# Patient Record
Sex: Male | Born: 2000 | Race: White | Hispanic: No | Marital: Single | Smoking: Never smoker
Health system: Southern US, Community
[De-identification: ages and names within clinical notes are randomized; demographics above are authoritative.]

## PROBLEM LIST (undated history)

## (undated) DIAGNOSIS — F419 Anxiety disorder, unspecified: Secondary | ICD-10-CM

## (undated) DIAGNOSIS — F845 Asperger's syndrome: Secondary | ICD-10-CM

## (undated) DIAGNOSIS — F319 Bipolar disorder, unspecified: Secondary | ICD-10-CM

## (undated) DIAGNOSIS — F329 Major depressive disorder, single episode, unspecified: Secondary | ICD-10-CM

## (undated) DIAGNOSIS — F32A Depression, unspecified: Secondary | ICD-10-CM

## (undated) HISTORY — PX: NO PAST SURGERIES: SHX2092

## (undated) HISTORY — DX: Bipolar disorder, unspecified: F31.9

---
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
## 2019-02-14 ENCOUNTER — Emergency Department
Admission: EM | Admit: 2019-02-14 | Discharge: 2019-02-15 | Disposition: A | Discharge status: Other Acute Inpatient Hospital | Payer: No Typology Code available for payment source | Attending: Emergency Medicine | Admitting: Emergency Medicine

## 2019-02-14 ENCOUNTER — Other Ambulatory Visit: Payer: Self-pay

## 2019-02-14 DIAGNOSIS — R454 Irritability and anger: Secondary | ICD-10-CM | POA: Diagnosis present

## 2019-02-14 DIAGNOSIS — F333 Major depressive disorder, recurrent, severe with psychotic symptoms: Secondary | ICD-10-CM | POA: Diagnosis not present

## 2019-02-14 DIAGNOSIS — R45851 Suicidal ideations: Secondary | ICD-10-CM | POA: Diagnosis not present

## 2019-02-14 DIAGNOSIS — F419 Anxiety disorder, unspecified: Secondary | ICD-10-CM | POA: Diagnosis not present

## 2019-02-14 DIAGNOSIS — F329 Major depressive disorder, single episode, unspecified: Secondary | ICD-10-CM | POA: Insufficient documentation

## 2019-02-14 DIAGNOSIS — F4310 Post-traumatic stress disorder, unspecified: Secondary | ICD-10-CM | POA: Diagnosis present

## 2019-02-14 DIAGNOSIS — F410 Panic disorder [episodic paroxysmal anxiety] without agoraphobia: Secondary | ICD-10-CM | POA: Diagnosis present

## 2019-02-14 DIAGNOSIS — F319 Bipolar disorder, unspecified: Secondary | ICD-10-CM | POA: Clinically undetermined

## 2019-02-14 DIAGNOSIS — F3160 Bipolar disorder, current episode mixed, unspecified: Secondary | ICD-10-CM

## 2019-02-14 DIAGNOSIS — Z20828 Contact with and (suspected) exposure to other viral communicable diseases: Secondary | ICD-10-CM | POA: Diagnosis not present

## 2019-02-14 DIAGNOSIS — Z79899 Other long term (current) drug therapy: Secondary | ICD-10-CM | POA: Insufficient documentation

## 2019-02-14 HISTORY — DX: Depression, unspecified: F32.A

## 2019-02-14 HISTORY — DX: Anxiety disorder, unspecified: F41.9

## 2019-02-14 LAB — CBC
HCT: 48.6 % (ref 39.0–52.0)
Hemoglobin: 16.4 g/dL (ref 13.0–17.0)
MCH: 29.9 pg (ref 26.0–34.0)
MCHC: 33.7 g/dL (ref 30.0–36.0)
MCV: 88.7 fL (ref 80.0–100.0)
Platelets: 271 10*3/uL (ref 150–400)
RBC: 5.48 MIL/uL (ref 4.22–5.81)
RDW: 12.9 % (ref 11.5–15.5)
WBC: 8.3 10*3/uL (ref 4.0–10.5)
nRBC: 0 % (ref 0.0–0.2)

## 2019-02-14 LAB — URINE DRUG SCREEN, QUALITATIVE (ARMC ONLY)
Amphetamines, Ur Screen: NOT DETECTED
Barbiturates, Ur Screen: NOT DETECTED
Benzodiazepine, Ur Scrn: POSITIVE — AB
Cannabinoid 50 Ng, Ur ~~LOC~~: NOT DETECTED
Cocaine Metabolite,Ur ~~LOC~~: NOT DETECTED
MDMA (Ecstasy)Ur Screen: NOT DETECTED
Methadone Scn, Ur: NOT DETECTED
Opiate, Ur Screen: NOT DETECTED
Phencyclidine (PCP) Ur S: NOT DETECTED
Tricyclic, Ur Screen: NOT DETECTED

## 2019-02-14 LAB — COMPREHENSIVE METABOLIC PANEL
ALT: 59 U/L — ABNORMAL HIGH (ref 0–44)
AST: 38 U/L (ref 15–41)
Albumin: 5 g/dL (ref 3.5–5.0)
Alkaline Phosphatase: 103 U/L (ref 38–126)
Anion gap: 15 (ref 5–15)
BUN: 17 mg/dL (ref 6–20)
CO2: 26 mmol/L (ref 22–32)
Calcium: 9.4 mg/dL (ref 8.9–10.3)
Chloride: 101 mmol/L (ref 98–111)
Creatinine, Ser: 0.93 mg/dL (ref 0.61–1.24)
GFR calc Af Amer: >60 mL/min (ref >60)
GFR calc non Af Amer: >60 mL/min (ref >60)
Glucose, Bld: 97 mg/dL (ref 70–99)
Potassium: 4.2 mmol/L (ref 3.5–5.1)
Sodium: 142 mmol/L (ref 135–145)
Total Bilirubin: 0.6 mg/dL (ref 0.3–1.2)
Total Protein: 8.4 g/dL — ABNORMAL HIGH (ref 6.5–8.1)

## 2019-02-14 LAB — ETHANOL: Alcohol, Ethyl (B): <10 mg/dL (ref <10)

## 2019-02-14 LAB — SALICYLATE LEVEL: Salicylate Lvl: <7 mg/dL (ref 2.8–30.0)

## 2019-02-14 LAB — SARS CORONAVIRUS 2 BY RT PCR (HOSPITAL ORDER, PERFORMED IN ~~LOC~~ HOSPITAL LAB): SARS Coronavirus 2: NEGATIVE

## 2019-02-14 LAB — ACETAMINOPHEN LEVEL: Acetaminophen (Tylenol), Serum: <10 ug/mL — ABNORMAL LOW (ref 10–30)

## 2019-02-14 MED ORDER — LORAZEPAM 2 MG PO TABS: 2.0000 mg | ORAL_TABLET | Freq: Two times a day (BID) | ORAL | Status: DC | PRN

## 2019-02-14 MED ORDER — MELATONIN 5 MG PO TABS
10.0000 mg | ORAL_TABLET | Freq: Every day | ORAL | Status: DC
  Administered 2019-02-14: 10 mg via ORAL
  Filled 2019-02-14: qty 2

## 2019-02-14 MED ORDER — ARIPIPRAZOLE 15 MG PO TABS
15.0000 mg | ORAL_TABLET | Freq: Every day | ORAL | Status: DC
  Filled 2019-02-14: qty 1

## 2019-02-14 MED ORDER — DULOXETINE HCL 60 MG PO CPEP: 60.0000 mg | ORAL_CAPSULE | Freq: Every day | ORAL | Status: DC

## 2019-02-14 MED ORDER — FLUOXETINE HCL 10 MG PO CAPS
10.0000 mg | ORAL_CAPSULE | Freq: Two times a day (BID) | ORAL | Status: DC
  Administered 2019-02-14: 22:00:00 10 mg via ORAL
  Filled 2019-02-14 (×2): qty 1

## 2019-02-14 MED ORDER — PRAZOSIN HCL 1 MG PO CAPS
1.0000 mg | ORAL_CAPSULE | Freq: Every day | ORAL | Status: DC
  Administered 2019-02-14: 22:00:00 1 mg via ORAL
  Filled 2019-02-14: qty 1
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
## 2019-02-14 NOTE — ED Triage Notes (Signed)
Pt brought here by mother, states his expressed thoughts of SI to his therapist and was referred to the ED. Pt states he almost overdosed last night but did not. States he recently changed medications.

## 2019-02-14 NOTE — ED Notes (Signed)
[facility] PENDING  CONSULT ?
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
## 2019-02-14 NOTE — ED Notes (Signed)
Pt dressed out with Larene Beach, RN and [employer] Officer Emogene Morgan. Pants, underwear, shirt and bracelet, shoes and phone placed in belonings

## 2019-02-14 NOTE — ED Notes (Signed)
Pt. Calm and cooperative, watching tv in room.

## 2019-02-14 NOTE — BH Assessment (Signed)
Patient has been accepted to [facility].  Patient assigned to [facility] Accepting physician is Dr. Jonelle Sports.  Call report to [phone].  Representative was [employer].   ER Staff is aware of it:  Vaughan Basta, ER Secretary  Juliane Lack, Patient's Nurse     Patient's mother [phone]) have been updated as well.  Address: [address] [place], [place] [address]  Patient can arrived anytime tomorrow (02/15/2019) after 8am.

## 2019-02-14 NOTE — ED Provider Notes (Signed)
[facility] Emergency Department Provider Note ____________________________________________   First MD Initiated Contact with Patient 02/14/19 1349     (approximate)  I have reviewed the triage vital signs and the nursing notes.   HISTORY  Chief Complaint Suicidal    HPI Samuel Russo is a 18 y.o. male with PMH as noted below who presents with suicidal ideation, gradual onset and worsening over the last few days.  The patient states that he thought about overdosing on medication last night although he did not decide on any specific medicine or where he would get it.  He denies any history of prior attempts.  He denies any acute medical complaints.  He states he has been compliant with his medication and denies drug or alcohol use.  Past Medical History:  Diagnosis Date  . Anxiety   . Asperger's disorder   . Depression     There are no active problems to display for this patient.   Past Surgical History:  Procedure Laterality Date  . NO PAST SURGERIES      Prior to Admission medications   Medication Sig Start Date End Date Taking? Authorizing Provider  ARIPiprazole (ABILIFY) 5 MG tablet Take 5 mg by mouth daily.    [provider]  lactobacillus acidophilus (BACID) TABS tablet Take 2 tablets by mouth 3 (three) times daily.    [provider]    Allergies Patient has no known allergies.  No family history on file.  Social History Social History   Tobacco Use  . Smoking status: Never Smoker  . Smokeless tobacco: Never Used  Substance Use Topics  . Alcohol use: No  . Drug use: No    Review of Systems  Constitutional: No fever. Eyes: No redness. ENT: No sore throat. Cardiovascular: Denies chest pain. Respiratory: Denies shortness of breath. Gastrointestinal: No vomiting or diarrhea.  Genitourinary: Negative for flank pain.  Musculoskeletal: Negative for back pain. Skin: Negative for rash. Neurological: Negative  for headache.   ____________________________________________   PHYSICAL EXAM:  VITAL SIGNS: ED Triage Vitals  Enc Vitals Group     BP 02/14/19 1328 113/70     Pulse Rate 02/14/19 1328 94     Resp 02/14/19 1328 17     Temp 02/14/19 1328 (!) 97.5 F (36.4 C)     Temp Source 02/14/19 1328 Oral     SpO2 02/14/19 1328 98 %     Weight 02/14/19 1330 208 lb (94.3 kg)     Height 02/14/19 1330 6' (1.829 m)     Head Circumference --      Peak Flow --      Pain Score 02/14/19 1329 0     Pain Loc --      Pain Edu? --      Excl. in [place]? --     Constitutional: Alert and oriented. Well appearing and in no acute distress. Eyes: Conjunctivae are normal.  Head: Atraumatic. Nose: No congestion/rhinnorhea. Mouth/Throat: Mucous membranes are moist.   Neck: Normal range of motion.  Cardiovascular: Good peripheral circulation. Respiratory: Normal respiratory effort.   Gastrointestinal: No distention.  Musculoskeletal: Extremities warm and well perfused.  Neurologic:  Normal speech and language. No gross focal neurologic deficits are appreciated.  Skin:  Skin is warm and dry. No rash noted. Psychiatric: Somewhat flat affect.  Speech and behavior are normal.  ____________________________________________   LABS (all labs ordered are listed, but only abnormal results are displayed)  Labs Reviewed  COMPREHENSIVE METABOLIC PANEL -  Abnormal; Notable for the following components:      Result Value   Total Protein 8.4 (*)    ALT 59 (*)    All other components within normal limits  ACETAMINOPHEN LEVEL - Abnormal; Notable for the following components:   Acetaminophen (Tylenol), Serum <10 (*)    All other components within normal limits  ETHANOL  SALICYLATE LEVEL  CBC  URINE DRUG SCREEN, QUALITATIVE ([facility] ONLY)   ____________________________________________  EKG   ____________________________________________  RADIOLOGY    ____________________________________________   PROCEDURES   Procedure(s) performed: No  Procedures  Critical Care performed: No ____________________________________________   INITIAL IMPRESSION / ASSESSMENT AND PLAN / ED COURSE  Pertinent labs & imaging results that were available during my care of the patient were reviewed by me and considered in my medical decision making (see chart for details).  18 year old male with PMH as noted above presents referred from his therapist with suicidal ideation.  The patient reports that he thought about overdosing last night but did not go through with it.  He denies any acute medical complaints.  On exam the patient is calm and cooperative.  His vital signs are normal.  The remainder of the exam is unremarkable.  Lab work-up is within normal limits.  We will obtain psychiatry consultation.  Disposition will be based on psychiatry team recommendations. ____________________________________________   FINAL CLINICAL IMPRESSION(S) / ED DIAGNOSES  Final diagnoses:  Suicidal ideation      NEW MEDICATIONS STARTED DURING THIS VISIT:  New Prescriptions   No medications on file     Note:  This document was prepared using Dragon voice recognition software and may include unintentional dictation errors.    Dionne BucySiadecki, Charese Abundis, MD 02/14/19 1441

## 2019-02-14 NOTE — BH Assessment (Signed)
Assessment Note  Samuel Russo is an 18 y.o. male who presents to the ER via his mother, after she was advised to do so by his therapist. While at his therapist appointment (02/14/2019), he shared he was having thoughts of ending his life and hearing voices. Per the patient, he and his family had an argument last night (02/15/2019) and he was remorseful about what happened. When he seen his therapist today, he thought everything was okay but was told he needed come to the ER.  Per the report of the patient's mother [phone]), the patient has an episode approximately every other month. However, last night wasn't the norm for him. Patient is known to take money or credits from his parents (once a month) and purchase things. When he is confronted about, he gets upset and move forwarded. However, last night he was more agitated then usual and he took a posturing stance towards his father. "This is not like him." Parents believe, its due to the recent changes in his medications. Patient has become more agitated and "short." After he does it, he apologizes for it. Patient is NOT physically aggressive or violent.  Patient was adopted when he was born by his current parents. It was agreed, that when turn [age] he could meet his biological mother if he wanted to.  Approximately two months ago, he met her for the first time. Patient states he do not believe his current mood has anything to do with meeting his mother. Per his (adopted) mother, the process of meeting his mother has gone well and the biological have been very respectful and pleasant towards them. While meeting her, they've found out more about his family history and diagnoses. Thus, they discovered one of the medications he was taking didn't work for her and believes it's the same with the patient. Patient currently receive medication management at [facility], with Dr. Maryruth Hancock.  During the interview, the patient was calm,  cooperative and pleasant. He was able to provide appropriate answers to the questions. Patient admits to hearing voices and they are telling him to end his life. He also admits to having thoughts of following through with what the voices are telling him by overdosing on medications.  Patient is diagnosed with autism and high functioning. He started home school last year due to having panic attacks while in the tradition setting. Per the patient and his mother, he is doing well and can handle most social settings, except large ones. He was inpatient at [facility] in 2018 due to depression. One of his family members committed suicide and the took it hard. Both the patient and his mother states, the hospitalization was beneficial and he had no problems while there.  Discussed patient with Psychiatrist (Dr. Leverne Humbles), patient is recommended for inpatient treatment.  Diagnosis: Depression  Past Medical History:  Past Medical History:  Diagnosis Date  . Anxiety   . Asperger's disorder   . Depression     Past Surgical History:  Procedure Laterality Date  . NO PAST SURGERIES      Family History: No family history on file.  Social History:  reports that he has never smoked. He has never used smokeless tobacco. He reports that he does not drink alcohol or use drugs.  Additional Social History:  Alcohol / Drug Use Pain Medications: See PTA Prescriptions: See PTA Over the Counter: See PTA History of alcohol / drug use?: No history of alcohol / drug abuse Longest period of sobriety (  when/how long): n/a  CIWA: CIWA-Ar BP: 113/70 Pulse Rate: 94 COWS:    Allergies: No Known Allergies  Home Medications: (Not in a hospital admission)   OB/GYN Status:  No LMP for male patient.  General Assessment Data Location of Assessment: [facility] ED TTS Assessment: In system Is this a Tele or Face-to-Face Assessment?: Face-to-Face Is this an Initial Assessment or a Re-assessment for this encounter?:  Initial Assessment Language Other than English: No Living Arrangements: Other (Comment)(Private Home) What gender do you identify as?: Male Marital status: Single Pregnancy Status: No Living Arrangements: Parent Can pt return to current living arrangement?: Yes Admission Status: Involuntary Petitioner: ED Attending Is patient capable of signing voluntary admission?: No(Under IVC) Referral Source: Self/Family/Friend Insurance type: [employer] [facility] Choice  Medical Screening Exam ([place]) Medical Exam completed: Yes  Crisis Care Plan Living Arrangements: Parent Legal Guardian: Mother, Father Name of Psychiatrist: [facility]. [facility]) Name of Therapist: Dr. Cletus Gash  Education Status Is patient currently in school?: Yes Current Grade: 12th Highest grade of school patient has completed: 11th Name of school: [employer] person: n/a IEP information if applicable: Home Bound due to anxiety  Risk to self with the past 6 months Suicidal Ideation: No-Not Currently/Within Last 6 Months Has patient been a risk to self within the past 6 months prior to admission? : Yes Suicidal Intent: No-Not Currently/Within Last 6 Months Has patient had any suicidal intent within the past 6 months prior to admission? : No Is patient at risk for suicide?: Yes Suicidal Plan?: No-Not Currently/Within Last 6 Months Has patient had any suicidal plan within the past 6 months prior to admission? : No Access to Means: No What has been your use of drugs/alcohol within the last 12 months?: Reports of none Previous Attempts/Gestures: No How many times?: 0 Other Self Harm Risks: Reports of none Triggers for Past Attempts: Other personal contacts(Death of friend) Intentional Self Injurious Behavior: None Family Suicide History: Unknown Recent stressful life event(s): Conflict (Comment), Other (Comment) Persecutory voices/beliefs?: Yes Depression: Yes Depression  Symptoms: Isolating, Guilt, Feeling worthless/self pity Substance abuse history and/or treatment for substance abuse?: No Suicide prevention information given to non-admitted patients: Not applicable  Risk to Others within the past 6 months Homicidal Ideation: No Does patient have any lifetime risk of violence toward others beyond the six months prior to admission? : No Thoughts of Harm to Others: No Current Homicidal Intent: No Current Homicidal Plan: No Access to Homicidal Means: No Identified Victim: Reports of none History of harm to others?: No Assessment of Violence: None Noted Violent Behavior Description: Reports of none Does patient have access to weapons?: No Criminal Charges Pending?: No Does patient have a court date: No Is patient on probation?: No  Psychosis Hallucinations: Auditory, With command Delusions: None noted  Mental Status Report Appearance/Hygiene: Unremarkable, In scrubs Eye Contact: Good Motor Activity: Freedom of movement, Unremarkable Speech: Logical/coherent, Unremarkable Level of Consciousness: Alert Mood: Depressed, Anxious, Sad, Pleasant Affect: Appropriate to circumstance Anxiety Level: Minimal Thought Processes: Coherent, Relevant Judgement: Unimpaired Orientation: Person, Place, Time, Situation, Appropriate for developmental age Obsessive Compulsive Thoughts/Behaviors: Minimal  Cognitive Functioning Concentration: Normal Memory: Recent Intact, Remote Intact Is patient IDD: No Insight: Fair Impulse Control: Fair Appetite: Good Have you had any weight changes? : No Change Sleep: No Change Total Hours of Sleep: 8 Vegetative Symptoms: None  ADLScreening [facility] Assessment Services) Patient's cognitive ability adequate to safely complete daily activities?: Yes Patient able to express need for assistance  with ADLs?: Yes Independently performs ADLs?: Yes (appropriate for developmental age)  Prior Inpatient Therapy Prior Inpatient  Therapy: Yes Prior Therapy Dates: 03/2017 Prior Therapy Facilty/Provider(s): [facility] Reason for Treatment: Depression  Prior Outpatient Therapy Prior Outpatient Therapy: Yes Prior Therapy Dates: Current Prior Therapy Facilty/Provider(s): [facility] Reason for Treatment: Medication Management Does patient have an ACCT team?: No Does patient have Intensive In-House Services?  : No Does patient have [facility] services? : No Does patient have P4CC services?: No  ADL Screening (condition at time of admission) Patient's cognitive ability adequate to safely complete daily activities?: Yes Is the patient deaf or have difficulty hearing?: No Does the patient have difficulty seeing, even when wearing glasses/contacts?: No Does the patient have difficulty concentrating, remembering, or making decisions?: No Patient able to express need for assistance with ADLs?: Yes Does the patient have difficulty dressing or bathing?: No Independently performs ADLs?: Yes (appropriate for developmental age) Does the patient have difficulty walking or climbing stairs?: No Weakness of Legs: None Weakness of Arms/Hands: None  Home Assistive Devices/Equipment Home Assistive Devices/Equipment: None  Therapy Consults (therapy consults require a physician order) PT Evaluation Needed: No OT Evalulation Needed: No SLP Evaluation Needed: No Abuse/Neglect Assessment (Assessment to be complete while patient is alone) Abuse/Neglect Assessment Can Be Completed: Yes Physical Abuse: Denies Verbal Abuse: Denies Sexual Abuse: Denies Exploitation of patient/patient's resources: Denies Self-Neglect: Denies Values / Beliefs Cultural Requests During Hospitalization: None Spiritual Requests During Hospitalization: None Consults Spiritual Care Consult Needed: No Social Work Consult Needed: No Regulatory affairs officer (For Healthcare) Does Patient Have a Medical Advance Directive?: No Would patient like  information on creating a medical advance directive?: No - Patient declined       Child/Adolescent Assessment Running Away Risk: Denies Bed-Wetting: Denies Destruction of Property: Denies Cruelty to Animals: Denies Stealing: Runner, broadcasting/film/video as Evidenced By: Parents credit cards to purchase things Rebellious/Defies Authority: Denies Satanic Involvement: Denies Science writer: Denies Problems at [employer]: Denies Gang Involvement: Denies  Disposition:  Disposition Initial Assessment Completed for this Encounter: Yes  On Site Evaluation by:   Reviewed with Physician:    Gunnar Fusi MS, LCAS, [facility], [place], [place] Therapeutic Triage Specialist 02/14/2019 4:19 PM

## 2019-02-14 NOTE — ED Notes (Signed)
[facility]  PENDING  PLACEMENT  CONSULT  DONE 

## 2019-02-14 NOTE — BH Assessment (Signed)
Referral information for Child/Adolescent Placement have been faxed to;    [facility] ([phone]) declined   [facility] ([phone])    [facility] [phone]),    [facility] [phone]),    [place] [phone] or [phone]),    [facility] [phone] -or- [phone])

## 2019-02-14 NOTE — ED Notes (Signed)
Pt. Accepted to [facility].  Pt. Can be transported after 8 a.m Thursday 6/18

## 2019-02-15 NOTE — ED Notes (Signed)
[facility]  DEPT  CALLED  FOR  TRANSFER 

## 2019-02-15 NOTE — ED Provider Notes (Signed)
-----------------------------------------   4:54 AM on 02/15/2019 -----------------------------------------   Blood pressure 114/66, pulse 97, temperature 98.3 F (36.8 C), temperature source Oral, resp. rate 16, height 1.829 m (6'), weight 94.3 kg, SpO2 98 %.  The patient is calm and cooperative at this time.  There have been no acute events since the last update.  Awaiting disposition plan from Behavioral Medicine team.   Hinda Kehr, MD 02/15/19 [phone]

## 2019-02-15 NOTE — ED Notes (Signed)
Pt's father, Fillmore Bynum, came to pick up patient's cell phone.  Cell phone given to father.  Pt's father also bought green duffle bag.

## 2019-05-17 DIAGNOSIS — F319 Bipolar disorder, unspecified: Secondary | ICD-10-CM | POA: Insufficient documentation

## 2022-10-15 ENCOUNTER — Ambulatory Visit: Payer: Self-pay

## 2023-11-29 DIAGNOSIS — K219 Gastro-esophageal reflux disease without esophagitis: Secondary | ICD-10-CM | POA: Insufficient documentation

## 2023-11-29 DIAGNOSIS — L729 Follicular cyst of the skin and subcutaneous tissue, unspecified: Secondary | ICD-10-CM | POA: Insufficient documentation

## 2024-05-04 NOTE — Progress Notes (Signed)
 Assessment/Plan:   1.  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    I explained that even if one is able to get off of the medication, it can take 6 months to reverse any potential SE.  I did not advise that the patient go off of medication, as this needs to be discussed with the patients prescribing physician and he may not be able to get off of meds due to hx of auditory hallucinations.    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  It appears that he was previously treated for his symptoms with Austedo and Ingrezza, which are medications for tardive dyskinesia.  The pathophysiology's of these disorders are different, although they can both come from antipsychotic medications.  Austedo and Ingrezza actually can worsen parkinsonism, and patient/mother described this.  - Patient's mother does ask for a second opinion on his psychiatric medications.  I did give her the name of Dr. Hisada, but did not provide the formal referral.  2.  Mild right hand dystonia  - His clumsiness is likely related to his diagnosis of autism.  Mother relates that he had trouble as a child doing fine motor and manual motor tasks and he was placed in occupational therapy.  This was even prior to being diagnosed with autism.  Nonetheless, given that he never had an MRI of the brain, I think it is important that we go ahead and do that.   Subjective:   Samuel Russo was seen today in the movement disorders clinic for neurologic consultation at the request of Ray Jacques BRAVO, PA-C.  The patient is noted to be a 23 year old male with a history of bipolar disorder, schizophrenia (mom denies this dx), and autism who is referred here for right hand tremor and a feeling of internal tremor. This patient is accompanied in the office by  his mother who supplements the history.  Medical records indicate that he was given Austedo and this did not help and it caused tremor and drooling.  He was also given ingrezza and that caused SE with drooling.   He was referred for further evaluation.  He is on cogentin and was just put back on it last week.  He was on it for 4 years; went off of it to trial ingrezza/austedo and is now back on it.    Developmentally, the patient walked when supposed to; talked when should.  Kindergarten at age 29 and had to repeat.  He was recommended for OT at age 60-11 for handwriting, buttoning, zipping, gripping pencil as he was behind on fine motor skills.  No neuroimaging until last year    Specific Symptoms:  Tremor: Yes.  Tremor has been going on 5 years but its been intermittent, R>L hand.  Its a rest and intentiontremor.   He is R handed.  Legs are never involved.  Face is never involved.  Tongue is never involved.   Off abilify  started at age 23 and off for a year; on fanapt x 3 years (was on fanapt and abilify  together previously).  Not worse with anxiety.  Not worse with caffeine.  On propranolol but only for social anxiety and doesn't use it for tremor Family hx of similar:  No., pt was adopted at 72 days old and bio fam hx is  limited Voice: no change Postural symptoms:  No.  Falls?  No. Bradykinesia symptoms: no bradykinesia noted Loss of smell:  No. Loss of taste:  No. Urinary Incontinence:  No. Difficulty Swallowing:  occasionally Handwriting, micrographia: No. Trouble with ADL's:  No.  Trouble buttoning clothing: No. Hallucinations:  has had auditory hallucinations but doesn't any longer (its been months); never visual hallucinations N/V:  No. Lightheaded:  Yes.  , mostly associated with bending down and getting back up and will get dizzy and have palpitation and sitting will resolve it  Syncope: No. Dyskinesia:  No. Prior exposure to reglan/antipsychotics: Yes.   (Currently fanapt; prior  abilify ; olanzapine).  Patient has also been on Austedo; lithium; benztropine, propranolol  Getting neuroimaging that the patient never had was a CT brain done March, 2025 and that was unremarkable.   ALLERGIES:   Allergies  Allergen Reactions   Valbenazine Other (See Comments)    Worsened tremors, drooling per pt    CURRENT MEDICATIONS:  Current Meds  Medication Sig   benztropine (COGENTIN) 1 MG tablet Take 1 mg by mouth 2 (two) times daily.   FANAPT 1 MG TABS Take 1 tablet by mouth at bedtime.   LORazepam  (ATIVAN ) 2 MG tablet Take 2 mg by mouth 2 (two) times daily as needed for anxiety.   Melatonin 10 MG TABS Take 10 mg by mouth at bedtime.   mirtazapine (REMERON) 15 MG tablet Take 15 mg by mouth at bedtime.   prazosin  (MINIPRESS ) 1 MG capsule Take 1 mg by mouth at bedtime. Increase to 2 capsule in 1 week   propranolol (INDERAL) 10 MG tablet Take 10 mg by mouth daily as needed.   [DISCONTINUED] DULoxetine  (CYMBALTA ) 60 MG capsule Take 60 mg by mouth daily.     Objective:   VITALS:   Vitals:   05/07/24 0857  BP: 119/77  Pulse: 65  SpO2: 98%  Weight: 193 lb 9.6 oz (87.8 kg)  Height: 5' 11 (1.803 m)    GEN:  The patient appears stated age and is in NAD.  He appears a bit anxious. HEENT:  Normocephalic, atraumatic.  The mucous membranes are moist. The superficial temporal arteries are without ropiness or tenderness. CV:  RRR Lungs:  CTAB Neck/HEME:  There are no carotid bruits bilaterally.  Neurological examination:  Orientation: The patient is alert and oriented x3.  He provides his own history fairly accurately, but does look to his mother for finer aspects. Cranial nerves: There is good facial symmetry. Extraocular muscles are intact.  There is some mild horizontal nystagmus that is only 3-4 beats at end gaze.  The visual fields are full to confrontational testing. The speech is fluent and clear. Soft palate rises symmetrically and there is no tongue deviation. Hearing  is intact to conversational tone. Sensation: Sensation is intact to light and pinprick throughout (facial, trunk, extremities). Vibration is intact at the bilateral big toe. There is no extinction with double simultaneous stimulation. There is no sensory dermatomal level identified. Motor: Strength is 5/5 in the bilateral upper and lower extremities.   Shoulder shrug is equal and symmetric.  There is no pronator drift. Deep tendon reflexes: Deep tendon reflexes are 2/4 at the bilateral biceps, triceps, brachioradialis, patella and achilles. Plantar responses are downgoing bilaterally.  Movement examination: Tone: There is normal tone in the bilateral upper extremities.  The tone in the lower extremities is normal.  Abnormal movements: Right hand is just slightly dystonic.  There is no rest tremor.  There  is no significant postural or intention tremor.  He is able to pour water from 1 glass to another without spilling.  He has trouble with the concept of Archimedes spirals, but there is no tremor with it.  Coordination:  There is no decremation with RAM's, with any form of RAMS, including alternating supination and pronation of the forearm, hand opening and closing, finger taps, heel taps and toe taps.  Gait and Station: The patient has no difficulty arising out of a deep-seated chair without the use of the hands. The patient's stride length is good.     I have reviewed and interpreted the following labs independently   Chemistry      Component Value Date/Time   NA 142 02/14/2019 1339   K 4.2 02/14/2019 1339   CL 101 02/14/2019 1339   CO2 26 02/14/2019 1339   BUN 17 02/14/2019 1339   CREATININE 0.93 02/14/2019 1339      Component Value Date/Time   CALCIUM 9.4 02/14/2019 1339   ALKPHOS 103 02/14/2019 1339   AST 38 02/14/2019 1339   ALT 59 (H) 02/14/2019 1339   BILITOT 0.6 02/14/2019 1339      No results found for: TSH Lab Results  Component Value Date   WBC 8.3 02/14/2019   HGB  16.4 02/14/2019   HCT 48.6 02/14/2019   MCV 88.7 02/14/2019   PLT 271 02/14/2019     Total time spent on today's visit was 60 minutes, including both face-to-face time and nonface-to-face time.  Time included that spent on review of records (prior notes available to me/labs/imaging if pertinent), discussing treatment and goals, answering patient's questions and coordinating care.  Cc:  Patient, No Pcp Per

## 2024-05-07 ENCOUNTER — Encounter: Payer: Self-pay | Admitting: Neurology

## 2024-05-07 ENCOUNTER — Ambulatory Visit (INDEPENDENT_AMBULATORY_CARE_PROVIDER_SITE_OTHER): Admitting: Neurology

## 2024-05-07 VITALS — BP 119/77 | HR 65 | Ht 71.0 in | Wt 193.6 lb

## 2024-05-07 DIAGNOSIS — G249 Dystonia, unspecified: Secondary | ICD-10-CM | POA: Diagnosis not present

## 2024-05-07 DIAGNOSIS — R440 Auditory hallucinations: Secondary | ICD-10-CM

## 2024-05-07 DIAGNOSIS — R251 Tremor, unspecified: Secondary | ICD-10-CM

## 2024-05-07 NOTE — Progress Notes (Signed)
 Prior authorization initiated for CPT [phone]. Approved: [id number].

## 2024-05-07 NOTE — Patient Instructions (Signed)
 You asked about a second opinion for psychiatry.  I often use Dr. Katheren Sleet.  Her contact information is as follows: Address: [address], [place], [place] [address] Phone: [phone]  You can ask your PCP for a referral , if that is needed.  I sent a referral for an MRI brain.

## 2024-05-17 ENCOUNTER — Ambulatory Visit
Admission: RE | Admit: 2024-05-17 | Discharge: 2024-05-17 | Disposition: A | Discharge status: Home / Self Care | Source: Ambulatory Visit | Attending: Neurology | Admitting: Neurology

## 2024-05-17 DIAGNOSIS — G249 Dystonia, unspecified: Secondary | ICD-10-CM | POA: Insufficient documentation

## 2024-05-17 DIAGNOSIS — R440 Auditory hallucinations: Secondary | ICD-10-CM | POA: Diagnosis present

## 2024-05-21 ENCOUNTER — Ambulatory Visit: Payer: Self-pay | Admitting: Neurology

## 2024-05-28 ENCOUNTER — Other Ambulatory Visit: Payer: Self-pay

## 2024-05-28 ENCOUNTER — Telehealth: Payer: Self-pay | Admitting: Neurology

## 2024-05-28 DIAGNOSIS — G249 Dystonia, unspecified: Secondary | ICD-10-CM

## 2024-05-28 DIAGNOSIS — R440 Auditory hallucinations: Secondary | ICD-10-CM

## 2024-05-28 DIAGNOSIS — R251 Tremor, unspecified: Secondary | ICD-10-CM

## 2024-05-28 NOTE — Telephone Encounter (Signed)
Referral sent to [facility]

## 2024-05-28 NOTE — Telephone Encounter (Signed)
 Called patients mom and she is looking to have patients doctors at [facility]. They are currently between PCP and Patient is waiting to get on board with a new PCP. Patient is still having tremors and it can take months to get in to [facility]. Asking if you would approve for me to send referral

## 2024-05-28 NOTE — Telephone Encounter (Signed)
 Pt mother called, she would like a referral sent to [facility] [place]. If you have questions you can call the mother back

## 2024-07-17 ENCOUNTER — Ambulatory Visit: Admitting: Psychiatry

## 2024-07-17 ENCOUNTER — Encounter: Payer: Self-pay | Admitting: Psychiatry

## 2024-07-17 VITALS — BP 110/68 | HR 78 | Temp 97.8°F | Ht 70.5 in | Wt 196.0 lb

## 2024-07-17 DIAGNOSIS — F4310 Post-traumatic stress disorder, unspecified: Secondary | ICD-10-CM | POA: Diagnosis not present

## 2024-07-17 DIAGNOSIS — F319 Bipolar disorder, unspecified: Secondary | ICD-10-CM

## 2024-07-17 DIAGNOSIS — F840 Autistic disorder: Secondary | ICD-10-CM

## 2024-07-17 DIAGNOSIS — R259 Unspecified abnormal involuntary movements: Secondary | ICD-10-CM | POA: Insufficient documentation

## 2024-07-17 DIAGNOSIS — F4010 Social phobia, unspecified: Secondary | ICD-10-CM | POA: Insufficient documentation

## 2024-07-17 NOTE — Progress Notes (Unsigned)
 Psychiatric Initial Adult Assessment   Patient Identification: Samuel Russo MRN:  [id number] Date of Evaluation:  07/17/2024 Referral Source: Samuel Russo Chief Complaint:   Chief Complaint  Patient presents with   Establish Care   Medication Refill   Medication Problem   Anxiety   Mood disorders   Autism   Visit Diagnosis:    ICD-10-CM   1. PTSD (post-traumatic stress disorder)  F43.10     2. Bipolar and related disorder (HCC)  F31.9    unspecified    3. Social anxiety disorder  F40.10     4. Autism spectrum disorder  F84.0     5. Abnormal involuntary movements  R25.9      Discussed the use of AI scribe software for clinical note transcription with the patient, who gave verbal consent to proceed.  History of Present Illness Samuel Russo is a 23 year old Caucasian male, single, lives in [place] with his family who has a history of autism spectrum disorder, bipolar disorder, generalized anxiety disorder, and a history of PTSD here for psychiatric evaluation.  He is accompanied by his mother Samuel Russo.  As per mother who provided collateral information patient is here for second opinion due to recent medication side effects.  His mother reports that he has experienced worsening tremors, which have fluctuated in intensity over the past few months and have interfered with his daily functioning, including difficulty using his right hand. He describes internal tremors and occasional eye tremors, previously severe enough to cause distress upon waking. Both he and his mother note that the tremors have improved following recent increases in benztropin (1 mg three times daily) and propranolol (10 mg three times daily), with neurology making the latter change. He reports mild sedation with propranolol but states that the tremors are currently well controlled.  A diagnosis of autism spectrum disorder (high functioning) includes longstanding sensory sensitivities and social  limitations. He reports that social situations, such as dining out or interacting with customers at work, remain challenging due to sensory overload and anxiety. He typically takes lorazepam  2 mg at bedtime for anxiety.He continues to experience social anxiety, particularly in unfamiliar or overstimulating environments, but states that he manages better when his medications are on board.  He and his mother describe a history of trauma related to persistent bullying throughout his school years, which contributed to the development of PTSD symptoms, including nightmares, intrusive memories, and negative self-talk. He previously experienced racing thoughts and voices, which he and his mother report as his own internal voice repeating negative statements. He reports that these symptoms have significantly improved over the past year and a half, coinciding with the addition of a support dog and ongoing use of mirtazapine (15 mg nightly) and Fanapt (1 mg daily). His mother notes that Fanapt has reduced his nightmares and voices, and he states that he no longer experiences these symptoms.  A history of bipolar disorder, diagnosed during his second hospitalization at age 35, includes three total hospitalizations for mood and psychotic symptoms. He reports past episodes of depression and voices, but currently denies any recent mood instability. He describes improved mood and functioning since discontinuing lamotrigine and lithium, and his mother notes that he is currently at his best. He reports no current or recent suicidal ideation, intent, or plans, though he acknowledges having passive thoughts in the past without any planning or attempts. He denies any history of aggression or homicidal ideation.  He reports improved sleep , though he occasionally wakes early, which  he attributes to his work schedule. He works part-time with his father and reports no difficulties at work aside from social anxiety. He describes  himself as a homebody and prefers not to leave the house. He does not drive despite having a permit and a car.   Psychiatric History:   Substance History: He and his caregiver explicitly deny any history of alcohol, tobacco, vaping, or recreational/illicit drug use. He reports never having had a sip of beer or wine, even with exposure. His caregiver states he has not engaged in any substance use, including cannabis, even when peers with similar diagnoses encouraged it. He reports no history of misuse of prescription medications. He has not received substance use treatment, and he has no legal or medical issues related to substance use.  Social History:  Family Psychiatric History:  Medical History: He has a diagnosis of stiff gait, flat feet, and a unique heart rhythm. His history includes a 9 mm brain cyst. His birth history includes emergency C-section due to cord around neck and low heart rate at birth, and his mother reports cyanosis. He experienced a severe allergic reaction to Ingrezza.   Associated Signs/Symptoms: Depression Symptoms:  denies at this time (Hypo) Manic Symptoms:  history of mood lability, racing thoughts , psychosis Anxiety Symptoms:  Social Anxiety, Psychotic Symptoms:  Hallucinations: Auditory per history PTSD Symptoms: Had a traumatic exposure:  per history Re-experiencing:  Intrusive Thoughts Nightmares Hypervigilance:  Yes Hyperarousal:  Difficulty Concentrating Emotional Numbness/Detachment Irritability/Anger Sleep  Past Psychiatric History: Previously was under the care of [facility].  Since the past 5 years he has been under the care of [facility] [place]. He trialed lithium for few years, followed by lamotrigine, both discontinued due to side effects or lack of benefit. He briefly tried olanzapine (Zyprexa) with poor tolerance. He experienced a severe allergic reaction to Ingrezza requiring  emergency care. He trialed Austedo for tremors, which worsened symptoms. He previously used prazosin  for nightmares, discontinued due to poor response. He has had three prior hospitalizations: first at [facility], second at [facility] in [place] around age 48 (2017), and third at [facility] in 2022 for depression and worsening voices. He denies any history of suicide attempts. He has participated in multiple past therapy experiences with various therapists, but both he and his caregiver report minimal perceived benefit and that PTSD was not adequately addressed.  He is currently undergoing psychotherapy at [facility].  Previous Psychotropic Medications: Yes as noted above  Substance Abuse History in the last 12 months:  No.  Consequences of Substance Abuse: Negative  Past Medical History:  Past Medical History:  Diagnosis Date   Anxiety    Asperger's disorder    Bipolar 1 disorder (HCC)    Depression     Past Surgical History:  Procedure Laterality Date   NO PAST SURGERIES      Family Psychiatric History: Patient was adopted when he was 71 days old.  His biological mother has a history of depression and anxiety and has used medication for these conditions throughout her life. His biological father reportedly has a history of depression and academic difficulties, as relayed by the biological mother and paternal grandmother.  Family History:  Family History  Adopted: Yes  Problem Relation Age of Onset   Depression Mother    Anxiety disorder Mother    Depression Father     Social History:   Social History   Socioeconomic History  Marital status: Single    Spouse name: Not on file   Number of children: Not on file   Years of education: 12   Highest education level: High school graduate  Occupational History    Employer: [employer]    Comment: part time  Tobacco Use   Smoking status: Never   Smokeless tobacco: Never  Vaping Use   Vaping  status: Never Used  Substance and Sexual Activity   Alcohol use: No   Drug use: No   Sexual activity: Not on file  Other Topics Concern   Not on file  Social History Narrative   Not on file   Social Drivers of Health   Financial Resource Strain: Not on file  Food Insecurity: No Food Insecurity (11/29/2023)   Received from [facility]   Hunger Vital Sign    Within the past 12 months, you worried that your food would run out before you got the money to buy more.: Never true    Within the past 12 months, the food you bought just didn't last and you didn't have money to get more.: Never true  Transportation Needs: No Transportation Needs (11/29/2023)   Received from [facility]   PRAPARE - Transportation    Lack of Transportation (Medical): No    Lack of Transportation (Non-Medical): No  Physical Activity: Not on file  Stress: Not on file  Social Connections: Not on file    Additional Social History: Patient was born in [place] .  He was adopted when he was 47 days old.  He graduated high school after repeating kindergarten and twelfth grade and received homebound instruction in high school. He had an IEP starting in kindergarten and received extra help in class. He had difficulty with math (dyscalculia) and writing (dysgraphia). 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 He currently lives at home with his parents. He works part time once a week at [employer] with his father. He does not drive independently, though he owns a car and has a permit. His mother describes him as a homebody. He has a support dog. He reports limited social interactions due to sensory overload.  He currently lives in [place] with his family.  He has a history of trauma due to bullying.  Allergies:   Allergies  Allergen Reactions   Valbenazine Other (See Comments)    Worsened tremors, drooling per pt    Metabolic Disorder Labs: No results found for:  HGBA1C, MPG No results found for: PROLACTIN No results found for: CHOL, TRIG, HDL, CHOLHDL, VLDL, LDLCALC No results found for: TSH  Therapeutic Level Labs: No results found for: LITHIUM No results found for: CBMZ No results found for: VALPROATE  Current Medications: Current Outpatient Medications  Medication Sig Dispense Refill   benztropine (COGENTIN) 1 MG tablet Take 1 mg by mouth 2 (two) times daily. (Patient taking differently: Take 1 mg by mouth 3 (three) times daily.)     FANAPT 1 MG TABS Take 1 tablet by mouth at bedtime.     ketoconazole (NIZORAL) 2 % shampoo SMARTSIG:Topical 2-3 Times Weekly     LORazepam  (ATIVAN ) 2 MG tablet Take 2 mg by mouth 2 (two) times daily as needed for anxiety. (Patient taking differently: Take 2 mg by mouth at bedtime.)     mirtazapine (REMERON) 15 MG tablet Take 15 mg by mouth at bedtime.     propranolol (INDERAL) 10 MG tablet Take 10 mg by mouth  daily as needed. (Patient taking differently: Take 10 mg by mouth 3 (three) times daily as needed.)     No current facility-administered medications for this visit.    Musculoskeletal: Strength & Muscle Tone: within normal limits Gait & Station: normal Patient leans: N/A  Psychiatric Specialty Exam: Review of Systems  Neurological:  Positive for tremors (BL upper extremities- improved).  Psychiatric/Behavioral:  Positive for sleep disturbance (improved). The patient is nervous/anxious.     Blood pressure 110/68, pulse 78, temperature 97.8 F (36.6 C), temperature source Temporal, height 5' 10.5 (1.791 m), weight 196 lb (88.9 kg), SpO2 98%.Body mass index is 27.73 kg/m.  General Appearance: Casual  Eye Contact:  Poor  Speech:  Normal Rate  Volume:  Normal  Mood:  Anxious  Affect:  Appropriate  Thought Process:  Goal Directed and Descriptions of Associations: Intact  Orientation:  Full (Time, Place, and Person)  Thought Content:  Logical  Suicidal Thoughts:  No   Homicidal Thoughts:  No  Memory:  Immediate;   Fair Recent;   Fair Remote;   Fair  Judgement:  Fair  Insight:  Fair  Psychomotor Activity:  normal  Concentration:  Concentration: Fair and Attention Span: Fair  Recall:  [employer] of Knowledge:Fair  Language: Fair  Akathisia:  No  Handed:  Right  AIMS (if indicated):  done  Assets:  Communication Skills Desire for Improvement Housing Social Support Transportation  ADL's:  Intact  Cognition: baseline  Sleep:  improving   Screenings: GAD-7    Flowsheet Row Office Visit from [facility]/17/2024 in [facility]  Total GAD-7 Score 3   PHQ2-9    Flowsheet Row Office Visit from 07/17/2024 in [facility]  PHQ-2 Total Score 0   Flowsheet Row Office Visit from 07/17/2024 in [facility] ED from 02/14/2019 in [facility]  C-SSRS RISK CATEGORY No Risk High Risk    Assessment and Plan Assessment & Plan Abnormal involuntary movement-likely due to neuroleptics-improving Tremors have improved with increased Benztropine and Propranolol, and no visible tremors were observed during examination. Although a neurologist suggested alternative antipsychotics with a lower risk of tremors, the current regimen is effective. Continue Benztropine 1 mg and Propranolol 10 mg, both three times a day. Monitor for side effects such as dizziness or lightheadedness.  Autism spectrum disorder-chronic He has high functioning autism with social anxiety and sensory overload. Social functioning has improved with the current medication regimen and support pet. Continue the current medication regimen and work with the [facility] program at [facility] for social anxiety management.  Social anxiety disorder-improving Anxiety is managed with the current medication regimen, though social anxiety persists, especially in public  settings. The support pet has been beneficial. Continue the current medication regimen and work with the [facility] program for anxiety management.  Bipolar and related disorder unspecified-improving His bipolar disorder is well-managed with the current medication regimen. There have been no recent hospitalizations or significant mood instability. Continue the current medication regimen which includes Fanapt 1 mg daily at bedtime  Post-traumatic stress disorder-improving PTSD symptoms have improved with the current medication regimen and support pet. There have been no recent intrusive memories or nightmares.  Continue mirtazapine 15 mg at bedtime Continue psychotherapy sessions.  Collateral information obtained from mother who was present in session today as well as from medical records from [facility] as well as neurology----  Follow-up Follow-up in clinic as  needed.  Patient here for second opinion and has a primary psychiatrist who is managing current medication regimen.   Collaboration of Care: {BH OP Collaboration of Care:[id number]}  Patient/Guardian was advised Release of Information must be obtained prior to any record release in order to collaborate their care with an outside provider. Patient/Guardian was advised if they have not already done so to contact the registration department to sign all necessary forms in order for us  to release information regarding their care.   Consent: Patient/Guardian gives verbal consent for treatment and assignment of benefits for services provided during this visit. Patient/Guardian expressed understanding and agreed to proceed.   Onnie Alatorre, MD 11/18/202510:35 AM
